# Patient Record
Sex: Female | Born: 2000 | Race: White | Hispanic: No | Marital: Single | Smoking: Never smoker
Health system: Southern US, Community
[De-identification: ages and names within clinical notes are randomized; demographics above are authoritative.]

---

## 2020-08-02 ENCOUNTER — Encounter (HOSPITAL_COMMUNITY): Payer: Self-pay

## 2020-08-02 ENCOUNTER — Emergency Department (HOSPITAL_COMMUNITY)
Admission: EM | Admit: 2020-08-02 | Discharge: 2020-08-02 | Disposition: A | Discharge status: Home / Self Care | Payer: BC Managed Care – PPO | Attending: Emergency Medicine | Admitting: Emergency Medicine

## 2020-08-02 ENCOUNTER — Emergency Department (HOSPITAL_COMMUNITY): Payer: BC Managed Care – PPO

## 2020-08-02 ENCOUNTER — Other Ambulatory Visit: Payer: Self-pay

## 2020-08-02 DIAGNOSIS — R102 Pelvic and perineal pain: Secondary | ICD-10-CM | POA: Insufficient documentation

## 2020-08-02 DIAGNOSIS — F419 Anxiety disorder, unspecified: Secondary | ICD-10-CM | POA: Diagnosis not present

## 2020-08-02 DIAGNOSIS — R000 Tachycardia, unspecified: Secondary | ICD-10-CM | POA: Insufficient documentation

## 2020-08-02 LAB — URINALYSIS, ROUTINE W REFLEX MICROSCOPIC
Bilirubin Urine: NEGATIVE
Glucose, UA: NEGATIVE mg/dL
Hgb urine dipstick: NEGATIVE
Ketones, ur: 20 mg/dL — AB
Nitrite: NEGATIVE
Protein, ur: NEGATIVE mg/dL
Specific Gravity, Urine: 1.014 (ref 1.005–1.030)
pH: 6 (ref 5.0–8.0)

## 2020-08-02 LAB — CBC WITH DIFFERENTIAL/PLATELET
Abs Immature Granulocytes: 0.05 10*3/uL (ref 0.00–0.07)
Basophils Absolute: 0 10*3/uL (ref 0.0–0.1)
Basophils Relative: 0 %
Eosinophils Absolute: 0 10*3/uL (ref 0.0–0.5)
Eosinophils Relative: 0 %
HCT: 39.9 % (ref 36.0–46.0)
Hemoglobin: 13.4 g/dL (ref 12.0–15.0)
Immature Granulocytes: 0 %
Lymphocytes Relative: 10 %
Lymphs Abs: 1.3 10*3/uL (ref 0.7–4.0)
MCH: 29.7 pg (ref 26.0–34.0)
MCHC: 33.6 g/dL (ref 30.0–36.0)
MCV: 88.5 fL (ref 80.0–100.0)
Monocytes Absolute: 0.9 10*3/uL (ref 0.1–1.0)
Monocytes Relative: 7 %
Neutro Abs: 10.8 10*3/uL — ABNORMAL HIGH (ref 1.7–7.7)
Neutrophils Relative %: 83 %
Platelets: 263 10*3/uL (ref 150–400)
RBC: 4.51 MIL/uL (ref 3.87–5.11)
RDW: 13.3 % (ref 11.5–15.5)
WBC: 13.1 10*3/uL — ABNORMAL HIGH (ref 4.0–10.5)
nRBC: 0 % (ref 0.0–0.2)

## 2020-08-02 LAB — I-STAT CHEM 8, ED
BUN: 12 mg/dL (ref 6–20)
Calcium, Ion: 1.17 mmol/L (ref 1.15–1.40)
Chloride: 105 mmol/L (ref 98–111)
Creatinine, Ser: 0.6 mg/dL (ref 0.44–1.00)
Glucose, Bld: 118 mg/dL — ABNORMAL HIGH (ref 70–99)
HCT: 41 % (ref 36.0–46.0)
Hemoglobin: 13.9 g/dL (ref 12.0–15.0)
Potassium: 3.3 mmol/L — ABNORMAL LOW (ref 3.5–5.1)
Sodium: 141 mmol/L (ref 135–145)
TCO2: 22 mmol/L (ref 22–32)

## 2020-08-02 LAB — I-STAT BETA HCG BLOOD, ED (MC, WL, AP ONLY): I-stat hCG, quantitative: <5 m[IU]/mL (ref <5)

## 2020-08-02 LAB — VALPROIC ACID LEVEL: Valproic Acid Lvl: 20 ug/mL — ABNORMAL LOW (ref 50.0–100.0)

## 2020-08-02 MED ORDER — DEXTROSE 5 % IV BOLUS: 1000.0000 mL | Freq: Once | INTRAVENOUS | Status: DC

## 2020-08-02 MED ORDER — NAPROXEN 375 MG PO TABS: 375.0000 mg | ORAL_TABLET | Freq: Two times a day (BID) | ORAL | 0 refills | Status: AC

## 2020-08-02 MED ORDER — KETOROLAC TROMETHAMINE 30 MG/ML IJ SOLN
30.0000 mg | Freq: Once | INTRAMUSCULAR | Status: AC
  Administered 2020-08-02: 30 mg via INTRAVENOUS
  Filled 2020-08-02: qty 1

## 2020-08-02 MED ORDER — SODIUM CHLORIDE 0.9 % IV BOLUS
1000.0000 mL | Freq: Once | INTRAVENOUS | Status: AC
  Administered 2020-08-02: 1000 mL via INTRAVENOUS

## 2020-08-02 NOTE — ED Triage Notes (Signed)
Pt complains of lower abd pain that radiates to her back, the pain started acutely tonight and she vomited once Pt also has rebound tenderness

## 2020-08-02 NOTE — ED Notes (Signed)
Pt. C/o lower abd pain that radiates to her back. Pt states pain was sharpe and came on suddenly . Pt states she did vomit one time

## 2020-08-02 NOTE — ED Provider Notes (Addendum)
[facility]-EMERGENCY DEPT Provider Note   CSN: [id number] Arrival date & time: 08/02/20  0013     History No chief complaint on file.   Amber Avila is a 19 y.o. female.  The history is provided by the Amber Avila.  Abdominal Cramping This is a new problem. The current episode started 3 to 5 hours ago. The problem occurs constantly. The problem has not changed since onset.Pertinent negatives include no chest pain, no headaches and no shortness of breath. Nothing aggravates the symptoms. Nothing relieves the symptoms. Amber Avila has tried nothing for the symptoms. The treatment provided no relief.  Friend with bipolar disorder on Depakote presents with lower abdominal pain.  Amber Avila reports (with tech present) that her friend started her period this evening and Amber Avila always gets hers at the same time.  But Amber Avila did not get her period but had intense pain and came in to be checked out.  No bleeding no discharge.  No diarrhea, no constipation.  No f/c/r.      History reviewed. No pertinent past medical history.  There are no problems to display for this Amber Avila.   History reviewed. No pertinent surgical history.   OB History   No obstetric history on file.     History reviewed. No pertinent family history.  Social History   Tobacco Use  . Smoking status: Never Smoker  . Smokeless tobacco: Never Used  Substance Use Topics  . Alcohol use: Never  . Drug use: Never    Home Medications Prior to Admission medications   Medication Sig Start Date End Date Taking? Authorizing Provider  ADVAIR DISKUS 100-50 MCG/DOSE AEPB Inhale 1 puff into the lungs 2 (two) times daily. 07/12/20  Yes [provider]  divalproex (DEPAKOTE) 250 MG DR tablet Take 250 mg by mouth at bedtime. 07/24/20  Yes [provider]  naproxen (NAPROSYN) 375 MG tablet Take 1 tablet (375 mg total) by mouth 2 (two) times daily with a meal. 08/02/20   Rolondo Pierre, MD     Allergies    Amber Avila has no known allergies.  Review of Systems   Review of Systems  Constitutional: Negative for fever.  HENT: Negative for congestion.   Eyes: Negative for visual disturbance.  Respiratory: Negative for shortness of breath.   Cardiovascular: Negative for chest pain.  Gastrointestinal: Negative for constipation and diarrhea.  Genitourinary: Positive for pelvic pain. Negative for dysuria, vaginal bleeding and vaginal discharge.  Musculoskeletal: Negative for arthralgias.  Neurological: Negative for headaches.  Psychiatric/Behavioral: Negative for agitation.  All other systems reviewed and are negative.   Physical Exam Updated Vital Signs BP 102/70   Pulse (!) 102   Temp 97.8 F (36.6 C)   Resp 15   Ht  (1.651 m)   Wt 56.7 kg   LMP 06/26/2020 Comment: negative beta HCG 08/02/20  SpO2 99%   BMI 20.80 kg/m   Physical Exam Vitals and nursing note reviewed.  Constitutional:      General: Amber Avila is not in acute distress.    Appearance: Normal appearance.  HENT:     Head: Normocephalic and atraumatic.     Nose: Nose normal.  Eyes:     Conjunctiva/sclera: Conjunctivae normal.     Pupils: Pupils are equal, round, and reactive to light.  Cardiovascular:     Rate and Rhythm: Regular rhythm. Tachycardia present.     Pulses: Normal pulses.     Heart sounds: Normal heart sounds.  Pulmonary:     Effort:  Pulmonary effort is normal.     Breath sounds: Normal breath sounds.  Abdominal:     General: Abdomen is flat. Bowel sounds are normal.     Palpations: Abdomen is soft.     Tenderness: There is no abdominal tenderness. There is no guarding or rebound.  Musculoskeletal:        General: Normal range of motion.     Cervical back: Normal range of motion and neck supple.  Skin:    General: Skin is warm and dry.     Capillary Refill: Capillary refill takes less than 2 seconds.  Neurological:     General: No focal deficit present.     Mental Status: Amber Avila  is alert and oriented to person, place, and time.     Deep Tendon Reflexes: Reflexes normal.  Psychiatric:        Mood and Affect: Mood is anxious. Affect is tearful.        Behavior: Behavior is aggressive.     ED Results / Procedures / Treatments   Labs (all labs ordered are listed, but only abnormal results are displayed) Results for orders placed or performed during the hospital encounter of 08/02/20  CBC with Differential/Platelet  Result Value Ref Range   WBC 13.1 (H) 4.0 - 10.5 K/uL   RBC 4.51 3.87 - 5.11 MIL/uL   Hemoglobin 13.4 12.0 - 15.0 g/dL   HCT 40.9 36 - 46 %   MCV 88.5 80.0 - 100.0 fL   MCH 29.7 26.0 - 34.0 pg   MCHC 33.6 30.0 - 36.0 g/dL   RDW 81.1 91.4 - 78.2 %   Platelets 263 150 - 400 K/uL   nRBC 0.0 0.0 - 0.2 %   Neutrophils Relative % 83 %   Neutro Abs 10.8 (H) 1.7 - 7.7 K/uL   Lymphocytes Relative 10 %   Lymphs Abs 1.3 0.7 - 4.0 K/uL   Monocytes Relative 7 %   Monocytes Absolute 0.9 0.1 - 1.0 K/uL   Eosinophils Relative 0 %   Eosinophils Absolute 0.0 0.0 - 0.5 K/uL   Basophils Relative 0 %   Basophils Absolute 0.0 0.0 - 0.1 K/uL   Immature Granulocytes 0 %   Abs Immature Granulocytes 0.05 0.00 - 0.07 K/uL  Urinalysis, Routine w reflex microscopic Urine, Clean Catch  Result Value Ref Range   Color, Urine STRAW (A) YELLOW   APPearance CLEAR CLEAR   Specific Gravity, Urine 1.014 1.005 - 1.030   pH 6.0 5.0 - 8.0   Glucose, UA NEGATIVE NEGATIVE mg/dL   Hgb urine dipstick NEGATIVE NEGATIVE   Bilirubin Urine NEGATIVE NEGATIVE   Ketones, ur 20 (A) NEGATIVE mg/dL   Protein, ur NEGATIVE NEGATIVE mg/dL   Nitrite NEGATIVE NEGATIVE   Leukocytes,Ua MODERATE (A) NEGATIVE   RBC / HPF 0-5 0 - 5 RBC/hpf   WBC, UA 0-5 0 - 5 WBC/hpf   Bacteria, UA RARE (A) NONE SEEN   Squamous Epithelial / LPF 0-5 0 - 5   Mucus PRESENT   Valproic acid level  Result Value Ref Range   Valproic Acid Lvl 20 (L) 50.0 - 100.0 ug/mL  I-stat chem 8, ED (not at [facility] or [facility])  Result  Value Ref Range   Sodium 141 135 - 145 mmol/L   Potassium 3.3 (L) 3.5 - 5.1 mmol/L   Chloride 105 98 - 111 mmol/L   BUN 12 6 - 20 mg/dL   Creatinine, Ser 9.56 0.44 - 1.00 mg/dL   Glucose, Bld 213 (  H) 70 - 99 mg/dL   Calcium, Ion 1.611.17 0.961.15 - 1.40 mmol/L   TCO2 22 22 - 32 mmol/L   Hemoglobin 13.9 12.0 - 15.0 g/dL   HCT 04.541.0 36 - 46 %  I-Stat Beta hCG blood, ED ([facility], [facility], [facility] only)  Result Value Ref Range   I-stat hCG, quantitative <5.0 <5 mIU/mL   Comment 3           CT ABDOMEN PELVIS WO CONTRAST  Result Date: 08/02/2020 CLINICAL DATA:  Lower abdominal pain EXAM: CT ABDOMEN AND PELVIS WITHOUT CONTRAST TECHNIQUE: Multidetector CT imaging of the abdomen and pelvis was performed following the standard protocol without IV contrast. COMPARISON:  None. FINDINGS: Lower chest: Lung bases are clear. Normal heart size. No pericardial effusion. Hepatobiliary: No visible focal liver lesion within the limitations of this unenhanced CT. Normal gallbladder and biliary tree. Pancreas: No pancreatic ductal dilatation or surrounding inflammatory changes. Spleen: Normal in size. No concerning splenic lesions. Adrenals/Urinary Tract: Normal adrenal glands. Kidneys are symmetric in size and normally located. No visible urolithiasis or hydronephrosis. No significant perinephric fluid or inflammation. No urolithiasis or hydronephrosis. Moderate bladder distension without significant wall thickening, calculi or debris. Stomach/Bowel: Distal esophagus, stomach and duodenum are unremarkable. No small bowel thickening or dilatation. Elongated air-filled appendix is seen curling about the cecal tip and extending towards the midline abdomen without thickening, dilatation or periappendiceal inflammation. No colonic dilatation or wall thickening. Vascular/Lymphatic: No significant vascular findings are present. No enlarged abdominal or pelvic lymph nodes. Reproductive: Anteverted uterus. Normal follicles present in both ovaries.  Slightly more heterogeneous attenuation rounded lesion arising in the right ovary may reflect a collapsing corpus luteum or small hemorrhagic cyst measuring up to 2.4 cm in size (axial 2/70, coronal 4/56). Other: Trace simple attenuation free fluid in the pelvis within the rectouterine pouch is nonspecific and often physiologic in a reproductive age female. No free air. No bowel containing hernia. Musculoskeletal: No acute osseous abnormality or suspicious osseous lesion. IMPRESSION: 1. No visible urolithiasis, hydronephrosis, or other CT evident urinary tract abnormality. 2. Rounded, heterogeneous attenuation lesion arising in the right ovary is likely to reflect a collapsing corpus luteum or small hemorrhagic cyst measuring up to 2.4 cm in size. Could be further evaluated with pelvic ultrasound if clinically indicated given pelvic discomfort. Otherwise, typically warrants no routine follow-up imaging in a reproductive age female. 3. Trace simple attenuation free fluid in the pelvis within the rectouterine pouch is nonspecific and often physiologic in a reproductive age female. Electronically Signed   By: Kreg ShropshirePrice  DeHay M.D.   On: 08/02/2020 03:05   US Pelvis Complete  Result Date: 08/02/2020 CLINICAL DATA:  Lower abdominal pain EXAM: TRANSABDOMINAL ULTRASOUND OF PELVIS DOPPLER ULTRASOUND OF OVARIES TECHNIQUE: Transabdominal ultrasound examination of the pelvis was performed including evaluation of the uterus, ovaries, adnexal regions, and pelvic cul-de-sac. Color and duplex Doppler ultrasound was utilized to evaluate blood flow to the ovaries. COMPARISON:  Abdomen and pelvis CT from earlier today FINDINGS: Uterus Measurements: 6 x 3 x 5 cm = volume: 46 mL. No fibroids or other mass visualized. Endometrium Thickness: 7 mm.  No focal abnormality visualized. Right ovary Measurements: 28 x 22 x 26 mm = volume: 8.3 mL. Normal appearance/no adnexal mass. Left ovary Measurements: 27 x 16 x 20 mm = volume: 4.6 mL.  Normal appearance/no adnexal mass. Pulsed Doppler evaluation demonstrates normal low-resistance arterial and venous waveforms in both ovaries. IMPRESSION: Normal pelvic ultrasound. Electronically Signed   By: Marnee SpringJonathon  Watts  M.D.   On: 08/02/2020 04:18   [place] Art/Ven Flow Abd Pelv Doppler  Result Date: 08/02/2020 CLINICAL DATA:  Lower abdominal pain EXAM: TRANSABDOMINAL ULTRASOUND OF PELVIS DOPPLER ULTRASOUND OF OVARIES TECHNIQUE: Transabdominal ultrasound examination of the pelvis was performed including evaluation of the uterus, ovaries, adnexal regions, and pelvic cul-de-sac. Color and duplex Doppler ultrasound was utilized to evaluate blood flow to the ovaries. COMPARISON:  Abdomen and pelvis CT from earlier today FINDINGS: Uterus Measurements: 6 x 3 x 5 cm = volume: 46 mL. No fibroids or other mass visualized. Endometrium Thickness: 7 mm.  No focal abnormality visualized. Right ovary Measurements: 28 x 22 x 26 mm = volume: 8.3 mL. Normal appearance/no adnexal mass. Left ovary Measurements: 27 x 16 x 20 mm = volume: 4.6 mL. Normal appearance/no adnexal mass. Pulsed Doppler evaluation demonstrates normal low-resistance arterial and venous waveforms in both ovaries. IMPRESSION: Normal pelvic ultrasound. Electronically Signed   By: Marnee Spring M.D.   On: 08/02/2020 04:18    Radiology CT ABDOMEN PELVIS WO CONTRAST  Result Date: 08/02/2020 CLINICAL DATA:  Lower abdominal pain EXAM: CT ABDOMEN AND PELVIS WITHOUT CONTRAST TECHNIQUE: Multidetector CT imaging of the abdomen and pelvis was performed following the standard protocol without IV contrast. COMPARISON:  None. FINDINGS: Lower chest: Lung bases are clear. Normal heart size. No pericardial effusion. Hepatobiliary: No visible focal liver lesion within the limitations of this unenhanced CT. Normal gallbladder and biliary tree. Pancreas: No pancreatic ductal dilatation or surrounding inflammatory changes. Spleen: Normal in size. No concerning splenic  lesions. Adrenals/Urinary Tract: Normal adrenal glands. Kidneys are symmetric in size and normally located. No visible urolithiasis or hydronephrosis. No significant perinephric fluid or inflammation. No urolithiasis or hydronephrosis. Moderate bladder distension without significant wall thickening, calculi or debris. Stomach/Bowel: Distal esophagus, stomach and duodenum are unremarkable. No small bowel thickening or dilatation. Elongated air-filled appendix is seen curling about the cecal tip and extending towards the midline abdomen without thickening, dilatation or periappendiceal inflammation. No colonic dilatation or wall thickening. Vascular/Lymphatic: No significant vascular findings are present. No enlarged abdominal or pelvic lymph nodes. Reproductive: Anteverted uterus. Normal follicles present in both ovaries. Slightly more heterogeneous attenuation rounded lesion arising in the right ovary may reflect a collapsing corpus luteum or small hemorrhagic cyst measuring up to 2.4 cm in size (axial 2/70, coronal 4/56). Other: Trace simple attenuation free fluid in the pelvis within the rectouterine pouch is nonspecific and often physiologic in a reproductive age female. No free air. No bowel containing hernia. Musculoskeletal: No acute osseous abnormality or suspicious osseous lesion. IMPRESSION: 1. No visible urolithiasis, hydronephrosis, or other CT evident urinary tract abnormality. 2. Rounded, heterogeneous attenuation lesion arising in the right ovary is likely to reflect a collapsing corpus luteum or small hemorrhagic cyst measuring up to 2.4 cm in size. Could be further evaluated with pelvic ultrasound if clinically indicated given pelvic discomfort. Otherwise, typically warrants no routine follow-up imaging in a reproductive age female. 3. Trace simple attenuation free fluid in the pelvis within the rectouterine pouch is nonspecific and often physiologic in a reproductive age female. Electronically  Signed   By: Kreg Shropshire M.D.   On: 08/02/2020 03:05   US Pelvis Complete  Result Date: 08/02/2020 CLINICAL DATA:  Lower abdominal pain EXAM: TRANSABDOMINAL ULTRASOUND OF PELVIS DOPPLER ULTRASOUND OF OVARIES TECHNIQUE: Transabdominal ultrasound examination of the pelvis was performed including evaluation of the uterus, ovaries, adnexal regions, and pelvic cul-de-sac. Color and duplex Doppler ultrasound was utilized to evaluate blood flow to  the ovaries. COMPARISON:  Abdomen and pelvis CT from earlier today FINDINGS: Uterus Measurements: 6 x 3 x 5 cm = volume: 46 mL. No fibroids or other mass visualized. Endometrium Thickness: 7 mm.  No focal abnormality visualized. Right ovary Measurements: 28 x 22 x 26 mm = volume: 8.3 mL. Normal appearance/no adnexal mass. Left ovary Measurements: 27 x 16 x 20 mm = volume: 4.6 mL. Normal appearance/no adnexal mass. Pulsed Doppler evaluation demonstrates normal low-resistance arterial and venous waveforms in both ovaries. IMPRESSION: Normal pelvic ultrasound. Electronically Signed   By: Marnee Spring M.D.   On: 08/02/2020 04:18   [place] Art/Ven Flow Abd Pelv Doppler  Result Date: 08/02/2020 CLINICAL DATA:  Lower abdominal pain EXAM: TRANSABDOMINAL ULTRASOUND OF PELVIS DOPPLER ULTRASOUND OF OVARIES TECHNIQUE: Transabdominal ultrasound examination of the pelvis was performed including evaluation of the uterus, ovaries, adnexal regions, and pelvic cul-de-sac. Color and duplex Doppler ultrasound was utilized to evaluate blood flow to the ovaries. COMPARISON:  Abdomen and pelvis CT from earlier today FINDINGS: Uterus Measurements: 6 x 3 x 5 cm = volume: 46 mL. No fibroids or other mass visualized. Endometrium Thickness: 7 mm.  No focal abnormality visualized. Right ovary Measurements: 28 x 22 x 26 mm = volume: 8.3 mL. Normal appearance/no adnexal mass. Left ovary Measurements: 27 x 16 x 20 mm = volume: 4.6 mL. Normal appearance/no adnexal mass. Pulsed Doppler evaluation  demonstrates normal low-resistance arterial and venous waveforms in both ovaries. IMPRESSION: Normal pelvic ultrasound. Electronically Signed   By: Marnee Spring M.D.   On: 08/02/2020 04:18    Procedures Procedures (including critical care time)  Medications Ordered in ED Medications  sodium chloride 0.9 % bolus 1,000 mL (0 mLs Intravenous Stopped 08/02/20 0222)  ketorolac (TORADOL) 30 MG/ML injection 30 mg (30 mg Intravenous Given 08/02/20 0409)    ED Course  I have reviewed the triage vital signs and the nursing notes.  Pertinent labs & imaging results that were available during my care of the Amber Avila were reviewed by me and considered in my medical decision making (see chart for details).   No appendicitis, no torsion. Urine is contaminated and not consistent with UTI.  No cysts on either ovary, normal flow to B ovaries. Abdominal exam is benign and reassuring.  I suspect the tachycardia was related to pain but also related to the anxiety as well.  HR is now improved prior to discharge.  I suspect this is menstrual cramp or bowel spasm related.  Stable for discharge with close follow up.   EDP went to speak with Amber Avila and is irate with her results.  Amber Avila stated " there is something wrong with me and I need to discuss with my mom."  EDP explained complete blood work was performed along with a CT of the abdomen and pelvis and a pelvic ultrasound.  EDP explained that all life threatening causes of pain had been ruled out and that we would send Amber Avila home with pain medications.  Amber Avila is angry and repeatedly stated Amber Avila does not believe EDP.  EDP stated Amber Avila could see her results in mychart.   Skarleth Delmonico was evaluated in Emergency Department on 08/02/2020 for the symptoms described in the history of present illness. Amber Avila was evaluated in the context of the global COVID-19 pandemic, which necessitated consideration that the Amber Avila might be at risk for infection with the SARS-CoV-2  virus that causes COVID-19. Institutional protocols and algorithms that pertain to the evaluation of patients at risk for COVID-19 are  in a state of rapid change based on information released by regulatory bodies including the CDC and federal and state organizations. These policies and algorithms were followed during the Amber Avila's care in the ED.  Final Clinical Impression(s) / ED Diagnoses Final diagnoses:  Pelvic pain   Return for intractable cough, coughing up blood,fevers >100.4 unrelieved by medication, shortness of breath, intractable vomiting, chest pain, shortness of breath, weakness,numbness, changes in speech, facial asymmetry,abdominal pain, passing out,Inability to tolerate liquids or food, cough, altered mental status or any concerns. No signs of systemic illness or infection. The Amber Avila is nontoxic-appearing on exam and vital signs are within normal limits.   I have reviewed the triage vital signs and the nursing notes. Pertinent labs &imaging results that were available during my care of the Amber Avila were reviewed by me and considered in my medical decision making (see chart for details).After history, exam, and medical workup I feel the Amber Avila has beenappropriately medically screened and is safe for discharge home. Pertinent diagnoses were discussed with the Amber Avila. Amber Avila was given return precautions.     Rx / DC Orders ED Discharge Orders         Ordered    naproxen (NAPROSYN) 375 MG tablet  2 times daily with meals        08/02/20 0431            Dachelle Molzahn, MD 08/02/20 7948

## 2021-04-14 IMAGING — CT CT ABD-PELV W/O CM
2 of 4 series · 15 of 46 positions shown, 17 images · non-contrast
Comparison: None.

CLINICAL DATA: Lower abdominal pain

EXAM:
CT ABDOMEN AND PELVIS WITHOUT CONTRAST
TECHNIQUE: Multidetector CT imaging of the abdomen and pelvis was performed
following the standard protocol without IV contrast.

[Series 2: axial st · axial · 0.58mm/px · z∈[-632,-212]mm · 12 of 92 slices shown, 14 images]
[im 4/92  soft-tissue]
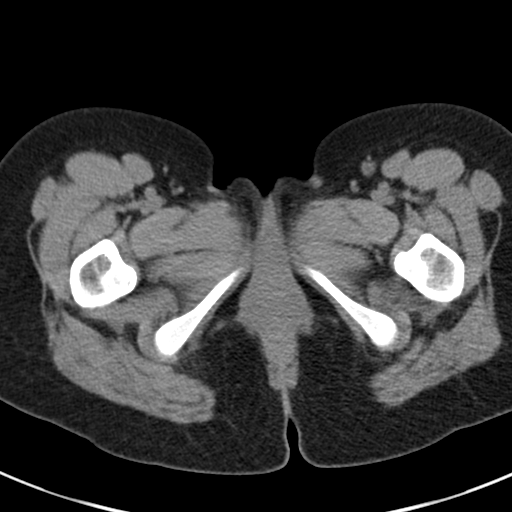
[im 4/92  bone]
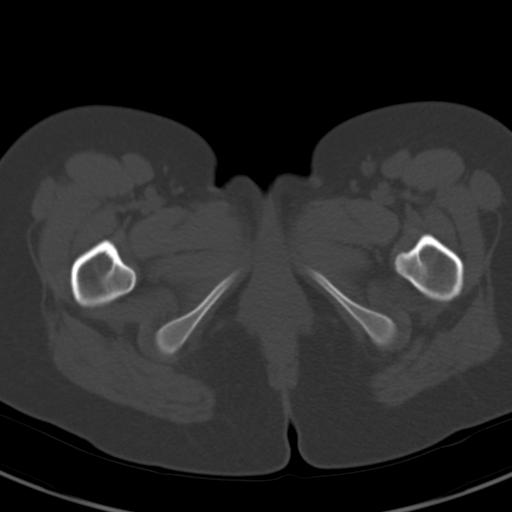
[im 12/92  soft-tissue]
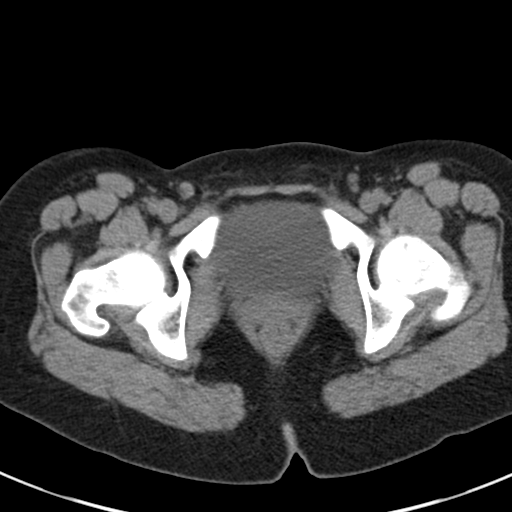
[im 20/92  soft-tissue]
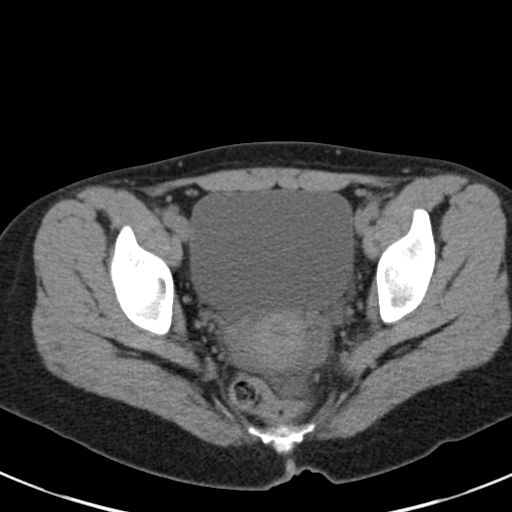
[im 28/92  soft-tissue]
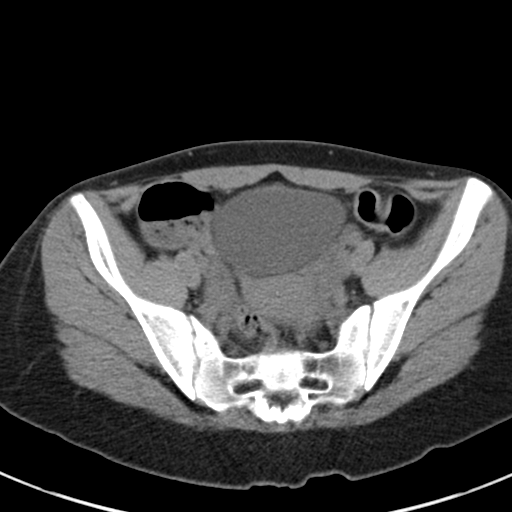
[im 36/92  soft-tissue]
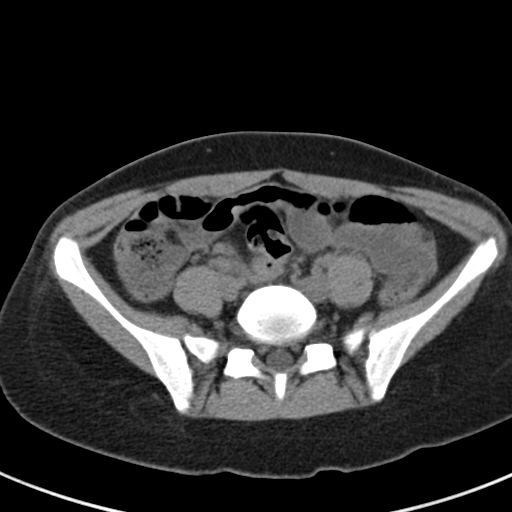
[im 44/92  soft-tissue]
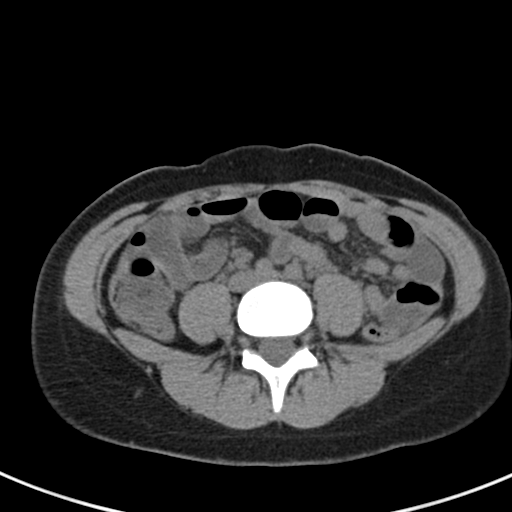
[im 48/92  soft-tissue]
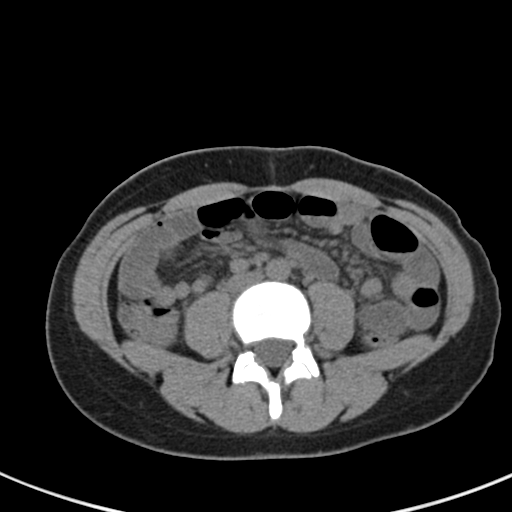
[im 56/92  soft-tissue]
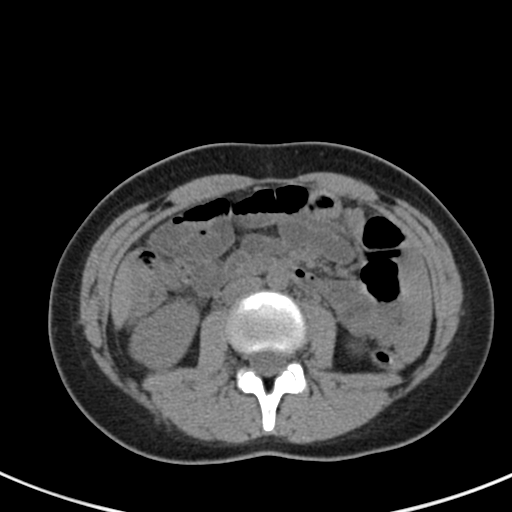
[im 64/92  soft-tissue]
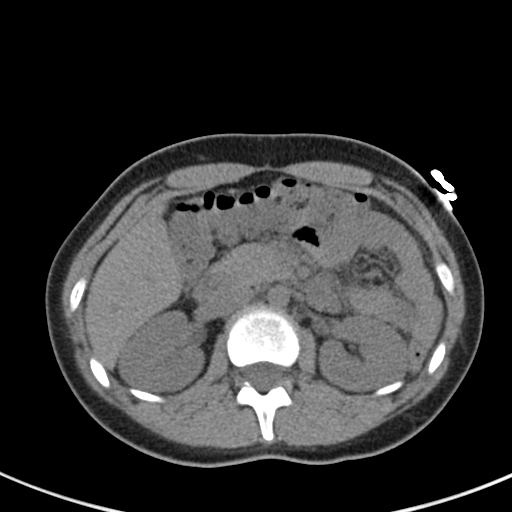
[im 64/92  bone]
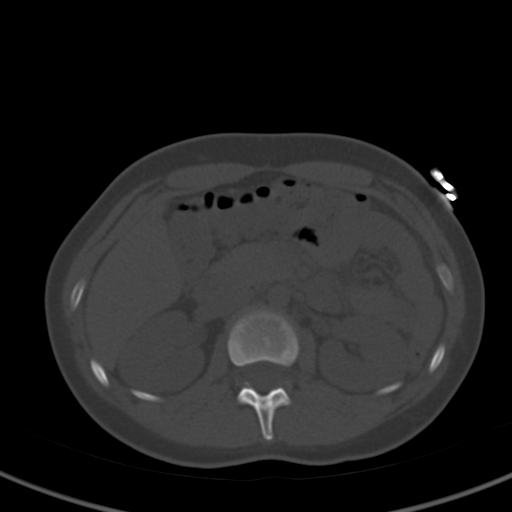
[im 72/92  soft-tissue]
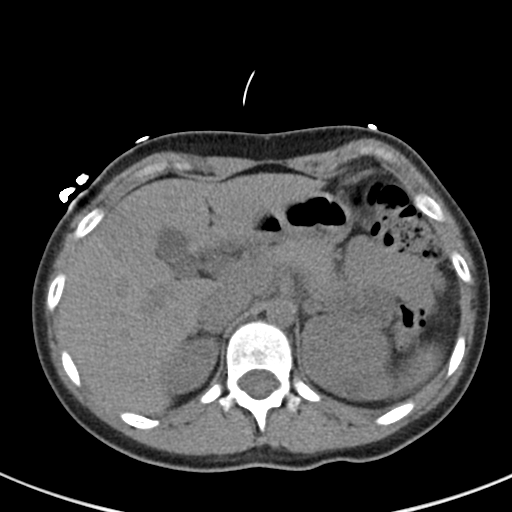
[im 80/92  soft-tissue]
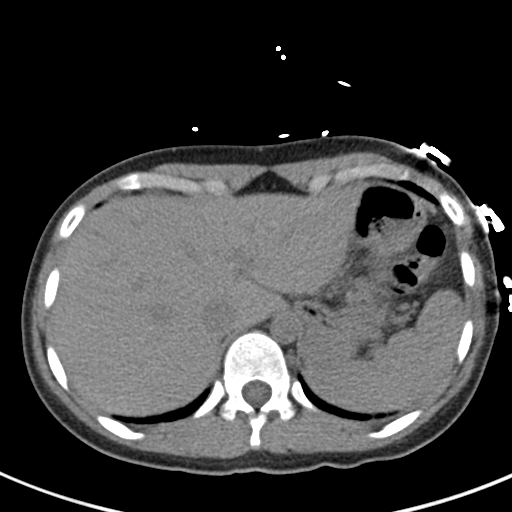
[im 88/92  soft-tissue]
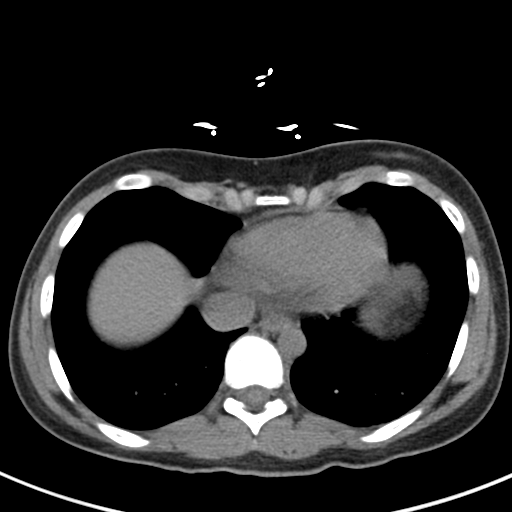

[Series 4: coronal st · coronal · 0.66mm/px · 3 of 99 slices shown]
[im 33/99  soft-tissue]
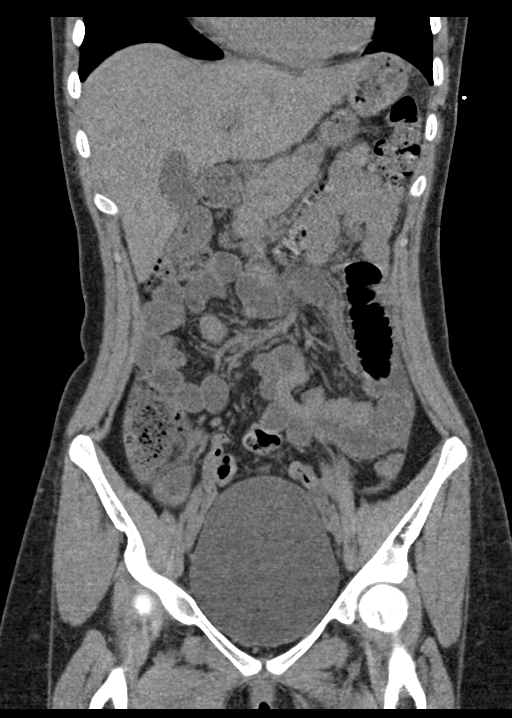
[im 44/99  soft-tissue]
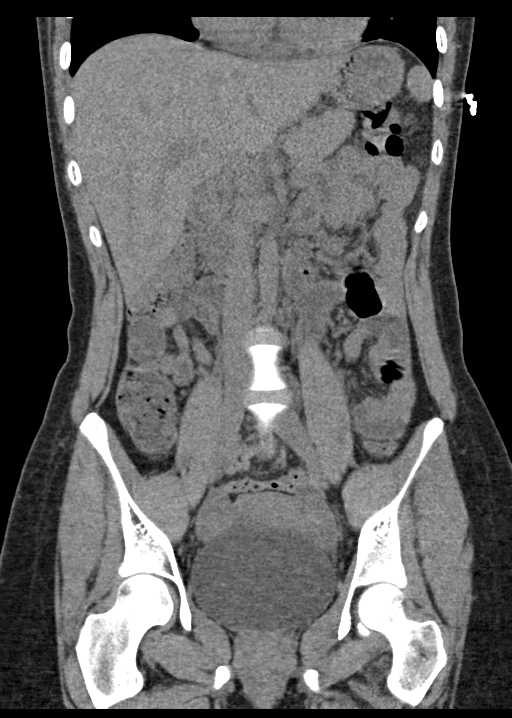
[im 55/99  soft-tissue]
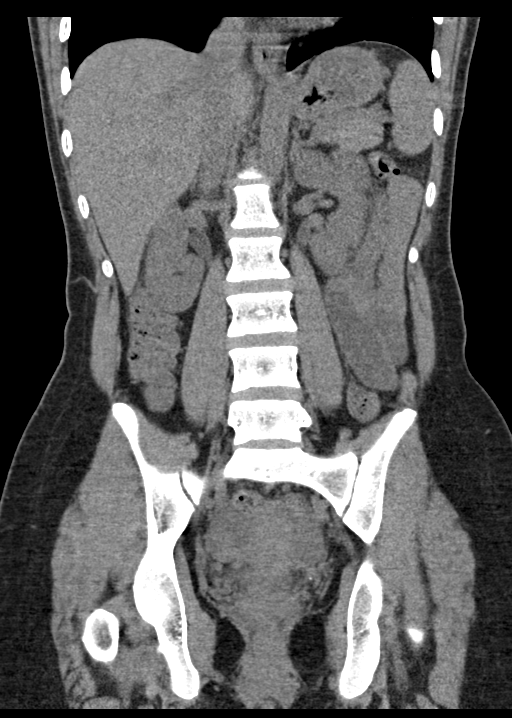

[15 of 46 positions shown; findings below may reference images not displayed]

FINDINGS: Lower chest: Lung bases are clear. Normal heart size. No pericardial
effusion.

Hepatobiliary: No visible focal liver lesion within the limitations
of this unenhanced CT. Normal gallbladder and biliary tree.

Pancreas: No pancreatic ductal dilatation or surrounding
inflammatory changes.

Spleen: Normal in size. No concerning splenic lesions.

Adrenals/Urinary Tract: Normal adrenal glands. Kidneys are symmetric
in size and normally located. No visible urolithiasis or
hydronephrosis. No significant perinephric fluid or inflammation. No
urolithiasis or hydronephrosis. Moderate bladder distension without
significant wall thickening, calculi or debris.

Stomach/Bowel: Distal esophagus, stomach and duodenum are
unremarkable. No small bowel thickening or dilatation. Elongated
air-filled appendix is seen curling about the cecal tip and
extending towards the midline abdomen without thickening, dilatation
or periappendiceal inflammation. No colonic dilatation or wall
thickening.

Vascular/Lymphatic: No significant vascular findings are present. No
enlarged abdominal or pelvic lymph nodes.

Reproductive: Anteverted uterus. Normal follicles present in both
ovaries. Slightly more heterogeneous attenuation rounded lesion
arising in the right ovary may reflect a collapsing corpus luteum or
small hemorrhagic cyst measuring up to 2.4 cm in size (axial 2/70,
coronal 4/56).

Other: Trace simple attenuation free fluid in the pelvis within the
rectouterine pouch is nonspecific and often physiologic in a
reproductive age female. No free air. No bowel containing hernia.

Musculoskeletal: No acute osseous abnormality or suspicious osseous
lesion.
IMPRESSION: 1. No visible urolithiasis, hydronephrosis, or other CT evident
urinary tract abnormality.
2. Rounded, heterogeneous attenuation lesion arising in the right
ovary is likely to reflect a collapsing corpus luteum or small
hemorrhagic cyst measuring up to 2.4 cm in size. Could be further
evaluated with pelvic ultrasound if clinically indicated given
pelvic discomfort. Otherwise, typically warrants no routine
follow-up imaging in a reproductive age female.
3. Trace simple attenuation free fluid in the pelvis within the
rectouterine pouch is nonspecific and often physiologic in a
reproductive age female.
